# Patient Record
Sex: Male | Born: 1955
Health system: Southern US, Community
[De-identification: ages and names within clinical notes are randomized; demographics above are authoritative.]

## PROBLEM LIST (undated history)

## (undated) DIAGNOSIS — G43909 Migraine, unspecified, not intractable, without status migrainosus: Secondary | ICD-10-CM

## (undated) DIAGNOSIS — E785 Hyperlipidemia, unspecified: Secondary | ICD-10-CM

## (undated) HISTORY — DX: Hyperlipidemia, unspecified: E78.5

## (undated) HISTORY — DX: Migraine, unspecified, not intractable, without status migrainosus: G43.909

## (undated) HISTORY — PX: HIATAL HERNIA REPAIR: SHX195

---

## 2007-04-20 ENCOUNTER — Ambulatory Visit (HOSPITAL_COMMUNITY): Admission: RE | Admit: 2007-04-20 | Discharge: 2007-04-20 | Payer: Self-pay | Admitting: Surgery

## 2007-04-20 ENCOUNTER — Encounter (INDEPENDENT_AMBULATORY_CARE_PROVIDER_SITE_OTHER): Payer: Self-pay | Admitting: Surgery

## 2011-03-09 NOTE — H&P (Signed)
NAME:  Dave Ferguson, Dave Ferguson              ACCOUNT NO.:  [id number]   MEDICAL RECORD NO.:  [id number]          PATIENT TYPE:  AMB   LOCATION:  DAY                          FACILITY:  [facility]   PHYSICIAN:  Sandria Bales. Ezzard Standing, M.D.  DATE OF BIRTH:  09-May-1956   DATE OF ADMISSION:  04/20/2007  DATE OF DISCHARGE:                              HISTORY & PHYSICAL   PREOPERATIVE DIAGNOSES:  1. Bilateral inguinal hernias, left larger than right.  2. Epigastric abdominal wall hernia.  3. Lipoma of right lateral abdomen.   POSTOPERATIVE DIAGNOSES:  1. Medium-sized direct left inguinal hernia.  2. Small direct right inguinal hernia.  3. Lipoma of cord on left side.  4. Lipoma of right hip, approximately 4 cm in diameter.  5. Epigastric ventral hernia.   PROCEDURE:  1. Laparoscopic bilateral inguinal hernia repair with Onlay mesh.  2. Open repair of epigastric abdominal wall hernia.  3. Excision of right hip lipoma.   SURGEON:  Sandria Bales. Ezzard Standing, M.D.   FIRST ASSISTANT:  None.   ANESTHESIA:  General.   ESTIMATED BLOOD LOSS:  Minimal.   INDICATION FOR PROCEDURE:  Mr. Pellow is a 55 year old white male who  is a patient of Dr. Duane Lope, who comes with increasing left inguinal  hernia and he has had a knot that has popped up above his umbilicus  consistent with an epigastric hernia.  He enters to this these hernias  repaired.  He also showed me a lipoma along his right lower abdomen and  right hip which he will have excised at the same time.   The indications and potential complications were explained to the  patient; potential complications include, but are not limited to,  bleeding, infection, nerve injury and recurrence of a hernia.   OPERATIVE NOTE:  The patient was placed in the supine position and given  a general endotracheal anesthetic, given 1 g of Ancef at the initiation  of the procedure.  He had a Foley catheter in place.  His abdomen was  shaved and prepped with Betadine solution  and sterilely draped.   A timeout was held, identifying the patient and the planned procedures.   OPERATIVE NOTE:  An infraumbilical incision was made with sharp  dissection and carried down to the rectus abdominis fascia.  I went to  the right anterior rectus abdominis and made an incision and this  retracted the rectus abdominis muscle anteriorly.  I then used the [place]  Surgical balloon kit to create a space in the preperitoneal space.  This  included an approximately 11-mm balloon Hasson trocar.  I insufflated  the preperitoneal space first with balloon dilatation and second with  CO2.   I then placed two 5-mm trocars, 1 in the right lower quadrant and 1 in  the left lower quadrant and in this preperitoneal space, I carried out  dissection, identifying the pubic tubercle, the Cooper's ligament on  both sides, the inferior epigastric vessels, the cord structures and  then laterally to the cord structures.   I identified a medium-sized left inguinal hernia and he also had a small  lipoma  of the cord on the left side.  On the right side, he had a small  early direct right inguinal hernia; his cord structures were otherwise  unremarkable.   I used an Onlay mesh, using a 6 x 6 piece of Atrium mesh, which I cut  down to approximately 4 x 6 inches.  I placed the mesh in the  preperitoneal space on both sides, taking staples to the pubic tubercle,  to Cooper's ligament, the transversalis fascia.  I avoided the area on  both sides lateral to the iliac vessels and inferior the iliopubic  tract.  I used 10 staples on the left side and 13 staples on the right  side; the mesh laid flat, appeared intact and in good position.   I then removed my trocars in turn with desufflation and so placed a 0  Vicryl in the anterior rectus fascia and closed the skin with a 5-0  Vicryl suture.   I then turned my attention to the epigastric hernia.  This hernia was  immediately above the umbilicus, but was  not an umbilical hernia, but a  true epigastric hernia.  He had a prepectoral fat nodule which was about  3 cm in diameter that I reduced back into the peritoneal cavity; this  left an approximate 1.5-cm fascial defect, which I closed with  interrupted 0 Novofil sutures; I used 4 of these and was able to close  the hernia with this defect.  I did infiltrate local anesthetic into  this wound and closed the subcutaneous tissues with 3-0 Vicryl suture  and the skin with a 5-0 Vicryl suture.   I then turned my attention to the right hip, where he had an  approximately 4.5-cm lipoma.  I am an incision directly over this and  was able to remove the lipoma without much difficulty.  Hemostasis was  controlled with Bovie electrocautery.  I closed the skin with a 5-0  Vicryl suture.  Each wound was painted with tincture of Benzoin; I used  a total of approximately 47 mL of 0.25% Marcaine as a local anesthetic  involving all the incisions.  The wound was sterilely dressed with 4 x  4's and patient transferred to recovery room in good condition.  Sponge  and needle count was correct at the end of the case.  The patient  tolerated the procedure well.  The lipoma was sent off for pathologic  reasons.  There was no pathology on the hernias.      Sandria Bales. Ezzard Standing, M.D.  Electronically Signed     DHN/MEDQ  D:  04/20/2007  T:  04/20/2007  Job:  [id number]   cc:   C. Duane Lope, M.D.  Fax: [phone]

## 2013-12-24 ENCOUNTER — Other Ambulatory Visit: Payer: Self-pay | Admitting: Ophthalmology

## 2017-02-01 DIAGNOSIS — H40053 Ocular hypertension, bilateral: Secondary | ICD-10-CM | POA: Diagnosis not present

## 2017-05-02 DIAGNOSIS — M5431 Sciatica, right side: Secondary | ICD-10-CM | POA: Diagnosis not present

## 2017-05-04 DIAGNOSIS — H40053 Ocular hypertension, bilateral: Secondary | ICD-10-CM | POA: Diagnosis not present

## 2017-05-12 DIAGNOSIS — M5416 Radiculopathy, lumbar region: Secondary | ICD-10-CM | POA: Diagnosis not present

## 2017-05-17 ENCOUNTER — Other Ambulatory Visit: Payer: Self-pay | Admitting: Family Medicine

## 2017-05-17 DIAGNOSIS — M5416 Radiculopathy, lumbar region: Secondary | ICD-10-CM

## 2017-05-30 ENCOUNTER — Ambulatory Visit
Admission: RE | Admit: 2017-05-30 | Discharge: 2017-05-30 | Disposition: A | Discharge status: Home / Self Care | Payer: 59 | Source: Ambulatory Visit | Attending: Family Medicine | Admitting: Family Medicine

## 2017-05-30 DIAGNOSIS — M5127 Other intervertebral disc displacement, lumbosacral region: Secondary | ICD-10-CM | POA: Diagnosis not present

## 2017-05-30 DIAGNOSIS — M5416 Radiculopathy, lumbar region: Secondary | ICD-10-CM

## 2017-06-13 DIAGNOSIS — M5126 Other intervertebral disc displacement, lumbar region: Secondary | ICD-10-CM | POA: Diagnosis not present

## 2017-06-13 DIAGNOSIS — M4726 Other spondylosis with radiculopathy, lumbar region: Secondary | ICD-10-CM | POA: Diagnosis not present

## 2017-06-13 DIAGNOSIS — M5441 Lumbago with sciatica, right side: Secondary | ICD-10-CM | POA: Diagnosis not present

## 2017-06-14 DIAGNOSIS — M5126 Other intervertebral disc displacement, lumbar region: Secondary | ICD-10-CM | POA: Diagnosis not present

## 2017-06-14 DIAGNOSIS — M4726 Other spondylosis with radiculopathy, lumbar region: Secondary | ICD-10-CM | POA: Diagnosis not present

## 2017-06-14 DIAGNOSIS — M5441 Lumbago with sciatica, right side: Secondary | ICD-10-CM | POA: Diagnosis not present

## 2017-06-17 DIAGNOSIS — M5441 Lumbago with sciatica, right side: Secondary | ICD-10-CM | POA: Diagnosis not present

## 2017-06-17 DIAGNOSIS — M5126 Other intervertebral disc displacement, lumbar region: Secondary | ICD-10-CM | POA: Diagnosis not present

## 2017-06-17 DIAGNOSIS — M4726 Other spondylosis with radiculopathy, lumbar region: Secondary | ICD-10-CM | POA: Diagnosis not present

## 2017-06-21 DIAGNOSIS — M5441 Lumbago with sciatica, right side: Secondary | ICD-10-CM | POA: Diagnosis not present

## 2017-06-21 DIAGNOSIS — M5126 Other intervertebral disc displacement, lumbar region: Secondary | ICD-10-CM | POA: Diagnosis not present

## 2017-06-21 DIAGNOSIS — M4726 Other spondylosis with radiculopathy, lumbar region: Secondary | ICD-10-CM | POA: Diagnosis not present

## 2017-06-23 DIAGNOSIS — M5126 Other intervertebral disc displacement, lumbar region: Secondary | ICD-10-CM | POA: Diagnosis not present

## 2017-06-23 DIAGNOSIS — M5441 Lumbago with sciatica, right side: Secondary | ICD-10-CM | POA: Diagnosis not present

## 2017-06-23 DIAGNOSIS — M4726 Other spondylosis with radiculopathy, lumbar region: Secondary | ICD-10-CM | POA: Diagnosis not present

## 2017-06-28 DIAGNOSIS — M4726 Other spondylosis with radiculopathy, lumbar region: Secondary | ICD-10-CM | POA: Diagnosis not present

## 2017-06-28 DIAGNOSIS — M5441 Lumbago with sciatica, right side: Secondary | ICD-10-CM | POA: Diagnosis not present

## 2017-06-28 DIAGNOSIS — M5126 Other intervertebral disc displacement, lumbar region: Secondary | ICD-10-CM | POA: Diagnosis not present

## 2017-07-01 DIAGNOSIS — M5126 Other intervertebral disc displacement, lumbar region: Secondary | ICD-10-CM | POA: Diagnosis not present

## 2017-07-01 DIAGNOSIS — M4726 Other spondylosis with radiculopathy, lumbar region: Secondary | ICD-10-CM | POA: Diagnosis not present

## 2017-07-01 DIAGNOSIS — M5441 Lumbago with sciatica, right side: Secondary | ICD-10-CM | POA: Diagnosis not present

## 2017-07-05 DIAGNOSIS — M5126 Other intervertebral disc displacement, lumbar region: Secondary | ICD-10-CM | POA: Diagnosis not present

## 2017-07-06 DIAGNOSIS — M5126 Other intervertebral disc displacement, lumbar region: Secondary | ICD-10-CM | POA: Diagnosis not present

## 2017-07-06 DIAGNOSIS — M4726 Other spondylosis with radiculopathy, lumbar region: Secondary | ICD-10-CM | POA: Diagnosis not present

## 2017-07-06 DIAGNOSIS — M5441 Lumbago with sciatica, right side: Secondary | ICD-10-CM | POA: Diagnosis not present

## 2017-07-12 DIAGNOSIS — M5126 Other intervertebral disc displacement, lumbar region: Secondary | ICD-10-CM | POA: Diagnosis not present

## 2017-07-12 DIAGNOSIS — M4726 Other spondylosis with radiculopathy, lumbar region: Secondary | ICD-10-CM | POA: Diagnosis not present

## 2017-07-12 DIAGNOSIS — M5441 Lumbago with sciatica, right side: Secondary | ICD-10-CM | POA: Diagnosis not present

## 2017-07-19 DIAGNOSIS — M4726 Other spondylosis with radiculopathy, lumbar region: Secondary | ICD-10-CM | POA: Diagnosis not present

## 2017-07-19 DIAGNOSIS — M5441 Lumbago with sciatica, right side: Secondary | ICD-10-CM | POA: Diagnosis not present

## 2017-07-19 DIAGNOSIS — M5126 Other intervertebral disc displacement, lumbar region: Secondary | ICD-10-CM | POA: Diagnosis not present

## 2017-07-26 DIAGNOSIS — M5126 Other intervertebral disc displacement, lumbar region: Secondary | ICD-10-CM | POA: Diagnosis not present

## 2017-07-26 DIAGNOSIS — M4726 Other spondylosis with radiculopathy, lumbar region: Secondary | ICD-10-CM | POA: Diagnosis not present

## 2017-07-26 DIAGNOSIS — Z23 Encounter for immunization: Secondary | ICD-10-CM | POA: Diagnosis not present

## 2017-07-26 DIAGNOSIS — M5441 Lumbago with sciatica, right side: Secondary | ICD-10-CM | POA: Diagnosis not present

## 2017-08-04 DIAGNOSIS — M5126 Other intervertebral disc displacement, lumbar region: Secondary | ICD-10-CM | POA: Diagnosis not present

## 2017-08-04 DIAGNOSIS — M4726 Other spondylosis with radiculopathy, lumbar region: Secondary | ICD-10-CM | POA: Diagnosis not present

## 2017-08-04 DIAGNOSIS — M5441 Lumbago with sciatica, right side: Secondary | ICD-10-CM | POA: Diagnosis not present

## 2017-08-08 DIAGNOSIS — M5126 Other intervertebral disc displacement, lumbar region: Secondary | ICD-10-CM | POA: Diagnosis not present

## 2017-08-16 DIAGNOSIS — M5126 Other intervertebral disc displacement, lumbar region: Secondary | ICD-10-CM | POA: Diagnosis not present

## 2017-08-16 DIAGNOSIS — M4726 Other spondylosis with radiculopathy, lumbar region: Secondary | ICD-10-CM | POA: Diagnosis not present

## 2017-08-16 DIAGNOSIS — M5441 Lumbago with sciatica, right side: Secondary | ICD-10-CM | POA: Diagnosis not present

## 2017-08-23 DIAGNOSIS — D225 Melanocytic nevi of trunk: Secondary | ICD-10-CM | POA: Diagnosis not present

## 2017-08-23 DIAGNOSIS — Z1283 Encounter for screening for malignant neoplasm of skin: Secondary | ICD-10-CM | POA: Diagnosis not present

## 2017-08-30 DIAGNOSIS — M4726 Other spondylosis with radiculopathy, lumbar region: Secondary | ICD-10-CM | POA: Diagnosis not present

## 2017-08-30 DIAGNOSIS — M5126 Other intervertebral disc displacement, lumbar region: Secondary | ICD-10-CM | POA: Diagnosis not present

## 2017-08-30 DIAGNOSIS — M5441 Lumbago with sciatica, right side: Secondary | ICD-10-CM | POA: Diagnosis not present

## 2017-09-01 DIAGNOSIS — Z0000 Encounter for general adult medical examination without abnormal findings: Secondary | ICD-10-CM | POA: Diagnosis not present

## 2017-09-01 DIAGNOSIS — M5136 Other intervertebral disc degeneration, lumbar region: Secondary | ICD-10-CM | POA: Diagnosis not present

## 2017-09-01 DIAGNOSIS — D229 Melanocytic nevi, unspecified: Secondary | ICD-10-CM | POA: Diagnosis not present

## 2017-09-01 DIAGNOSIS — E7800 Pure hypercholesterolemia, unspecified: Secondary | ICD-10-CM | POA: Diagnosis not present

## 2017-09-01 DIAGNOSIS — H409 Unspecified glaucoma: Secondary | ICD-10-CM | POA: Diagnosis not present

## 2017-09-13 DIAGNOSIS — M4726 Other spondylosis with radiculopathy, lumbar region: Secondary | ICD-10-CM | POA: Diagnosis not present

## 2017-09-13 DIAGNOSIS — H40053 Ocular hypertension, bilateral: Secondary | ICD-10-CM | POA: Diagnosis not present

## 2017-09-13 DIAGNOSIS — M5441 Lumbago with sciatica, right side: Secondary | ICD-10-CM | POA: Diagnosis not present

## 2017-09-13 DIAGNOSIS — M5126 Other intervertebral disc displacement, lumbar region: Secondary | ICD-10-CM | POA: Diagnosis not present

## 2017-09-13 DIAGNOSIS — H524 Presbyopia: Secondary | ICD-10-CM | POA: Diagnosis not present

## 2017-09-27 DIAGNOSIS — M5441 Lumbago with sciatica, right side: Secondary | ICD-10-CM | POA: Diagnosis not present

## 2017-09-27 DIAGNOSIS — M5126 Other intervertebral disc displacement, lumbar region: Secondary | ICD-10-CM | POA: Diagnosis not present

## 2017-09-27 DIAGNOSIS — M4726 Other spondylosis with radiculopathy, lumbar region: Secondary | ICD-10-CM | POA: Diagnosis not present

## 2017-10-14 DIAGNOSIS — M5441 Lumbago with sciatica, right side: Secondary | ICD-10-CM | POA: Diagnosis not present

## 2017-10-14 DIAGNOSIS — M4726 Other spondylosis with radiculopathy, lumbar region: Secondary | ICD-10-CM | POA: Diagnosis not present

## 2017-10-14 DIAGNOSIS — M5126 Other intervertebral disc displacement, lumbar region: Secondary | ICD-10-CM | POA: Diagnosis not present

## 2017-10-27 DIAGNOSIS — M5441 Lumbago with sciatica, right side: Secondary | ICD-10-CM | POA: Diagnosis not present

## 2017-10-27 DIAGNOSIS — M4726 Other spondylosis with radiculopathy, lumbar region: Secondary | ICD-10-CM | POA: Diagnosis not present

## 2017-10-27 DIAGNOSIS — M5126 Other intervertebral disc displacement, lumbar region: Secondary | ICD-10-CM | POA: Diagnosis not present

## 2017-11-09 DIAGNOSIS — M5441 Lumbago with sciatica, right side: Secondary | ICD-10-CM | POA: Diagnosis not present

## 2017-11-09 DIAGNOSIS — M5126 Other intervertebral disc displacement, lumbar region: Secondary | ICD-10-CM | POA: Diagnosis not present

## 2017-11-09 DIAGNOSIS — M4726 Other spondylosis with radiculopathy, lumbar region: Secondary | ICD-10-CM | POA: Diagnosis not present

## 2017-11-23 DIAGNOSIS — M4726 Other spondylosis with radiculopathy, lumbar region: Secondary | ICD-10-CM | POA: Diagnosis not present

## 2017-11-23 DIAGNOSIS — M5126 Other intervertebral disc displacement, lumbar region: Secondary | ICD-10-CM | POA: Diagnosis not present

## 2017-11-23 DIAGNOSIS — M5441 Lumbago with sciatica, right side: Secondary | ICD-10-CM | POA: Diagnosis not present

## 2017-12-14 DIAGNOSIS — M5441 Lumbago with sciatica, right side: Secondary | ICD-10-CM | POA: Diagnosis not present

## 2017-12-14 DIAGNOSIS — M5126 Other intervertebral disc displacement, lumbar region: Secondary | ICD-10-CM | POA: Diagnosis not present

## 2017-12-14 DIAGNOSIS — M4726 Other spondylosis with radiculopathy, lumbar region: Secondary | ICD-10-CM | POA: Diagnosis not present

## 2018-01-04 DIAGNOSIS — M5441 Lumbago with sciatica, right side: Secondary | ICD-10-CM | POA: Diagnosis not present

## 2018-01-04 DIAGNOSIS — M5126 Other intervertebral disc displacement, lumbar region: Secondary | ICD-10-CM | POA: Diagnosis not present

## 2018-01-04 DIAGNOSIS — M4726 Other spondylosis with radiculopathy, lumbar region: Secondary | ICD-10-CM | POA: Diagnosis not present

## 2018-01-25 DIAGNOSIS — M5441 Lumbago with sciatica, right side: Secondary | ICD-10-CM | POA: Diagnosis not present

## 2018-01-25 DIAGNOSIS — M5126 Other intervertebral disc displacement, lumbar region: Secondary | ICD-10-CM | POA: Diagnosis not present

## 2018-01-25 DIAGNOSIS — M4726 Other spondylosis with radiculopathy, lumbar region: Secondary | ICD-10-CM | POA: Diagnosis not present

## 2018-02-22 DIAGNOSIS — M5126 Other intervertebral disc displacement, lumbar region: Secondary | ICD-10-CM | POA: Diagnosis not present

## 2018-02-22 DIAGNOSIS — M5441 Lumbago with sciatica, right side: Secondary | ICD-10-CM | POA: Diagnosis not present

## 2018-02-22 DIAGNOSIS — M4726 Other spondylosis with radiculopathy, lumbar region: Secondary | ICD-10-CM | POA: Diagnosis not present

## 2018-03-07 DIAGNOSIS — H40053 Ocular hypertension, bilateral: Secondary | ICD-10-CM | POA: Diagnosis not present

## 2018-03-15 DIAGNOSIS — M5126 Other intervertebral disc displacement, lumbar region: Secondary | ICD-10-CM | POA: Diagnosis not present

## 2018-03-15 DIAGNOSIS — M4726 Other spondylosis with radiculopathy, lumbar region: Secondary | ICD-10-CM | POA: Diagnosis not present

## 2018-03-15 DIAGNOSIS — M5441 Lumbago with sciatica, right side: Secondary | ICD-10-CM | POA: Diagnosis not present

## 2018-04-12 DIAGNOSIS — M5126 Other intervertebral disc displacement, lumbar region: Secondary | ICD-10-CM | POA: Diagnosis not present

## 2018-04-12 DIAGNOSIS — M4726 Other spondylosis with radiculopathy, lumbar region: Secondary | ICD-10-CM | POA: Diagnosis not present

## 2018-04-12 DIAGNOSIS — M5441 Lumbago with sciatica, right side: Secondary | ICD-10-CM | POA: Diagnosis not present

## 2018-05-10 DIAGNOSIS — M4726 Other spondylosis with radiculopathy, lumbar region: Secondary | ICD-10-CM | POA: Diagnosis not present

## 2018-05-10 DIAGNOSIS — M5441 Lumbago with sciatica, right side: Secondary | ICD-10-CM | POA: Diagnosis not present

## 2018-05-10 DIAGNOSIS — M5126 Other intervertebral disc displacement, lumbar region: Secondary | ICD-10-CM | POA: Diagnosis not present

## 2018-06-01 DIAGNOSIS — M4726 Other spondylosis with radiculopathy, lumbar region: Secondary | ICD-10-CM | POA: Diagnosis not present

## 2018-06-01 DIAGNOSIS — M5441 Lumbago with sciatica, right side: Secondary | ICD-10-CM | POA: Diagnosis not present

## 2018-06-01 DIAGNOSIS — M5126 Other intervertebral disc displacement, lumbar region: Secondary | ICD-10-CM | POA: Diagnosis not present

## 2018-07-05 DIAGNOSIS — M5441 Lumbago with sciatica, right side: Secondary | ICD-10-CM | POA: Diagnosis not present

## 2018-07-05 DIAGNOSIS — M4726 Other spondylosis with radiculopathy, lumbar region: Secondary | ICD-10-CM | POA: Diagnosis not present

## 2018-07-05 DIAGNOSIS — M5126 Other intervertebral disc displacement, lumbar region: Secondary | ICD-10-CM | POA: Diagnosis not present

## 2018-08-09 DIAGNOSIS — M4726 Other spondylosis with radiculopathy, lumbar region: Secondary | ICD-10-CM | POA: Diagnosis not present

## 2018-08-09 DIAGNOSIS — N644 Mastodynia: Secondary | ICD-10-CM | POA: Diagnosis not present

## 2018-08-09 DIAGNOSIS — M5441 Lumbago with sciatica, right side: Secondary | ICD-10-CM | POA: Diagnosis not present

## 2018-08-09 DIAGNOSIS — M5126 Other intervertebral disc displacement, lumbar region: Secondary | ICD-10-CM | POA: Diagnosis not present

## 2018-08-15 ENCOUNTER — Other Ambulatory Visit: Payer: Self-pay | Admitting: Family Medicine

## 2018-08-15 DIAGNOSIS — N644 Mastodynia: Secondary | ICD-10-CM

## 2018-08-16 ENCOUNTER — Other Ambulatory Visit: Payer: Self-pay | Admitting: Family Medicine

## 2018-08-16 DIAGNOSIS — N644 Mastodynia: Secondary | ICD-10-CM

## 2018-08-21 ENCOUNTER — Ambulatory Visit
Admission: RE | Admit: 2018-08-21 | Discharge: 2018-08-21 | Disposition: A | Discharge status: Home / Self Care | Payer: 59 | Source: Ambulatory Visit | Attending: Family Medicine | Admitting: Family Medicine

## 2018-08-21 ENCOUNTER — Ambulatory Visit: Payer: 59

## 2018-08-21 DIAGNOSIS — N644 Mastodynia: Secondary | ICD-10-CM

## 2018-08-21 DIAGNOSIS — R928 Other abnormal and inconclusive findings on diagnostic imaging of breast: Secondary | ICD-10-CM | POA: Diagnosis not present

## 2018-08-31 DIAGNOSIS — Z125 Encounter for screening for malignant neoplasm of prostate: Secondary | ICD-10-CM | POA: Diagnosis not present

## 2018-08-31 DIAGNOSIS — Z0000 Encounter for general adult medical examination without abnormal findings: Secondary | ICD-10-CM | POA: Diagnosis not present

## 2018-08-31 DIAGNOSIS — E7800 Pure hypercholesterolemia, unspecified: Secondary | ICD-10-CM | POA: Diagnosis not present

## 2018-09-06 DIAGNOSIS — Z0000 Encounter for general adult medical examination without abnormal findings: Secondary | ICD-10-CM | POA: Diagnosis not present

## 2018-09-14 DIAGNOSIS — M4726 Other spondylosis with radiculopathy, lumbar region: Secondary | ICD-10-CM | POA: Diagnosis not present

## 2018-09-14 DIAGNOSIS — M5126 Other intervertebral disc displacement, lumbar region: Secondary | ICD-10-CM | POA: Diagnosis not present

## 2018-09-14 DIAGNOSIS — M5441 Lumbago with sciatica, right side: Secondary | ICD-10-CM | POA: Diagnosis not present

## 2018-09-18 DIAGNOSIS — H40053 Ocular hypertension, bilateral: Secondary | ICD-10-CM | POA: Diagnosis not present

## 2018-09-18 DIAGNOSIS — H524 Presbyopia: Secondary | ICD-10-CM | POA: Diagnosis not present

## 2018-10-12 DIAGNOSIS — M4726 Other spondylosis with radiculopathy, lumbar region: Secondary | ICD-10-CM | POA: Diagnosis not present

## 2018-10-12 DIAGNOSIS — M5126 Other intervertebral disc displacement, lumbar region: Secondary | ICD-10-CM | POA: Diagnosis not present

## 2018-10-12 DIAGNOSIS — M5441 Lumbago with sciatica, right side: Secondary | ICD-10-CM | POA: Diagnosis not present

## 2018-10-13 DIAGNOSIS — Z0000 Encounter for general adult medical examination without abnormal findings: Secondary | ICD-10-CM | POA: Diagnosis not present

## 2018-11-07 DIAGNOSIS — M5126 Other intervertebral disc displacement, lumbar region: Secondary | ICD-10-CM | POA: Diagnosis not present

## 2018-11-07 DIAGNOSIS — M5441 Lumbago with sciatica, right side: Secondary | ICD-10-CM | POA: Diagnosis not present

## 2018-11-07 DIAGNOSIS — M4726 Other spondylosis with radiculopathy, lumbar region: Secondary | ICD-10-CM | POA: Diagnosis not present

## 2018-12-05 DIAGNOSIS — M5126 Other intervertebral disc displacement, lumbar region: Secondary | ICD-10-CM | POA: Diagnosis not present

## 2018-12-05 DIAGNOSIS — M5441 Lumbago with sciatica, right side: Secondary | ICD-10-CM | POA: Diagnosis not present

## 2018-12-05 DIAGNOSIS — M4726 Other spondylosis with radiculopathy, lumbar region: Secondary | ICD-10-CM | POA: Diagnosis not present

## 2019-01-15 DIAGNOSIS — M4726 Other spondylosis with radiculopathy, lumbar region: Secondary | ICD-10-CM | POA: Diagnosis not present

## 2019-01-15 DIAGNOSIS — M5441 Lumbago with sciatica, right side: Secondary | ICD-10-CM | POA: Diagnosis not present

## 2019-01-15 DIAGNOSIS — M5126 Other intervertebral disc displacement, lumbar region: Secondary | ICD-10-CM | POA: Diagnosis not present

## 2019-01-25 DIAGNOSIS — L237 Allergic contact dermatitis due to plants, except food: Secondary | ICD-10-CM | POA: Diagnosis not present

## 2019-02-28 DIAGNOSIS — H40053 Ocular hypertension, bilateral: Secondary | ICD-10-CM | POA: Diagnosis not present

## 2019-07-14 IMAGING — MG DIGITAL DIAGNOSTIC BILATERAL MAMMOGRAM WITH TOMO AND CAD
8 series · 9 of 24 positions shown · non-contrast
Comparison: None.

CLINICAL DATA: 62-year-old male complaining of pain and a palpable
abnormality in the right breast.

EXAM:
DIGITAL DIAGNOSTIC BILATERAL MAMMOGRAM WITH CAD AND TOMO

[L CC synth-2D]
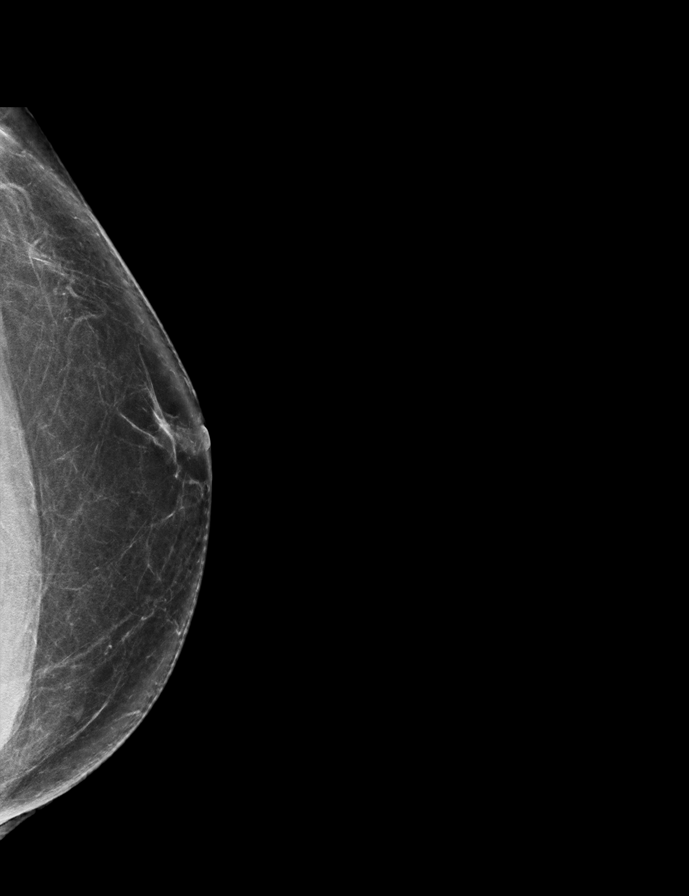

[R MLO synth-2D]
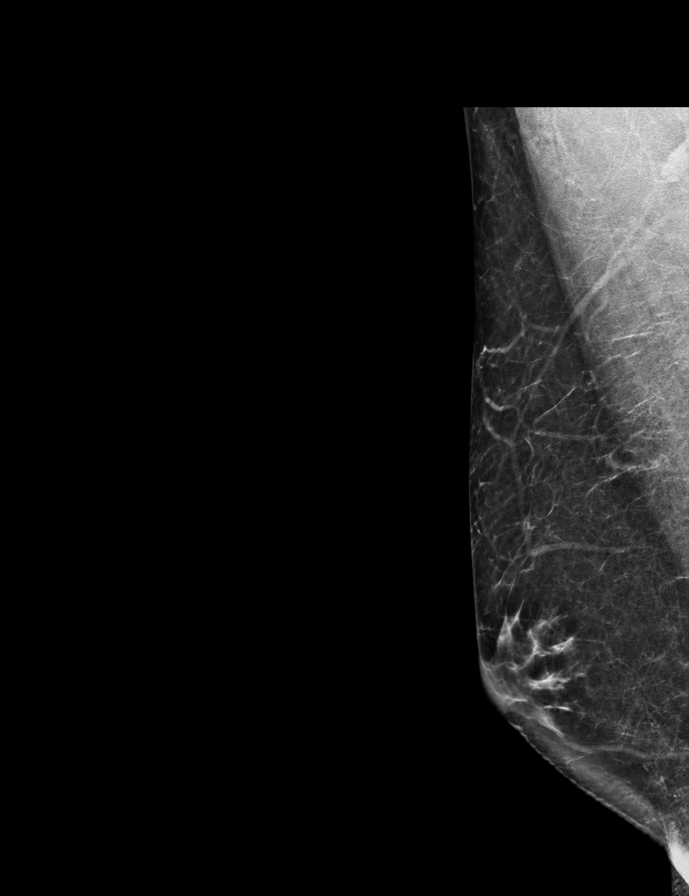

[L MLO synth-2D]
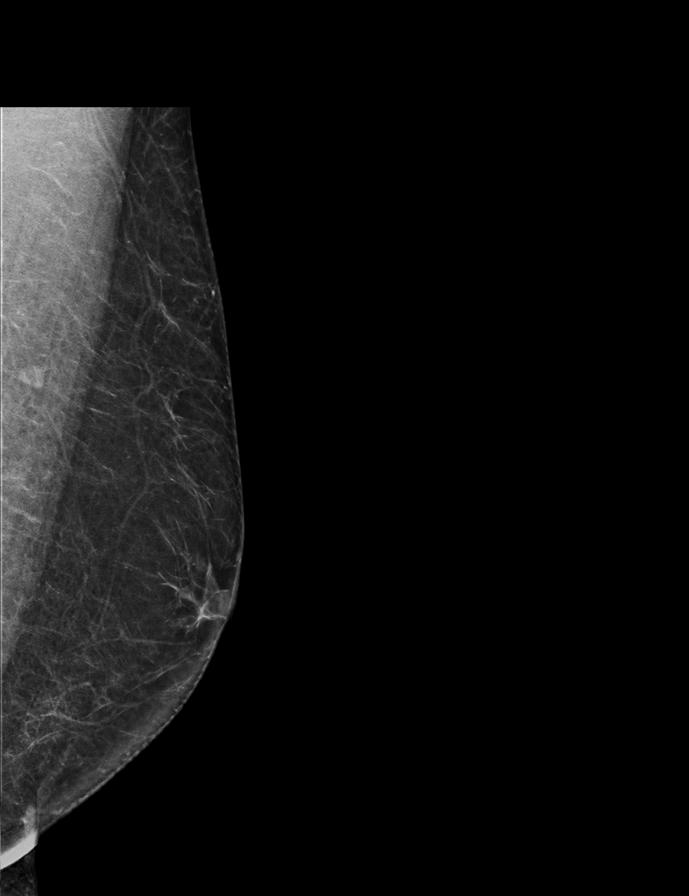

[R CC synth-2D]
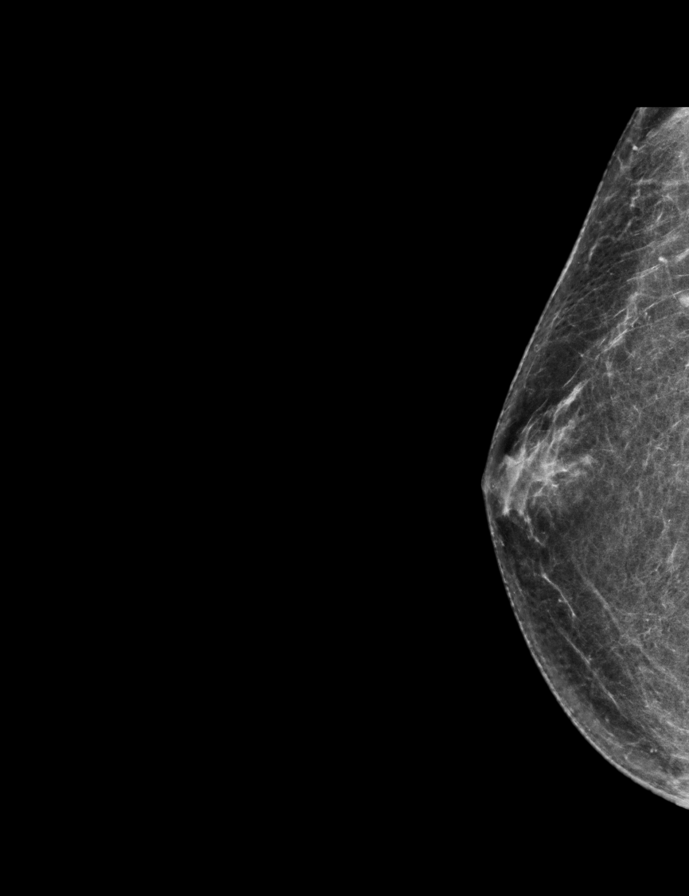

[R CC tomo · 2 of 65 frames shown]
[frame 21/65]
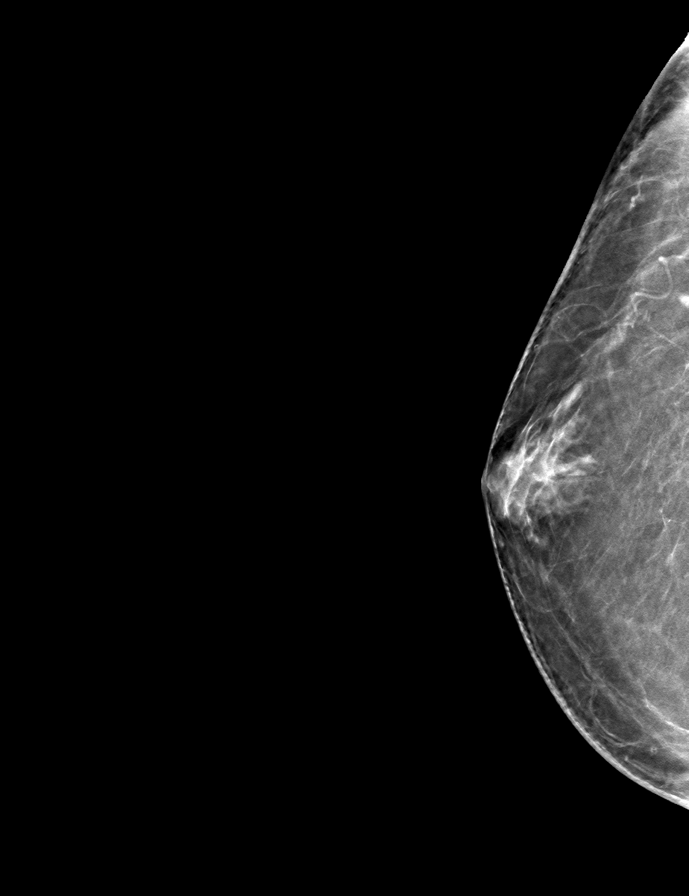
[frame 33/65]
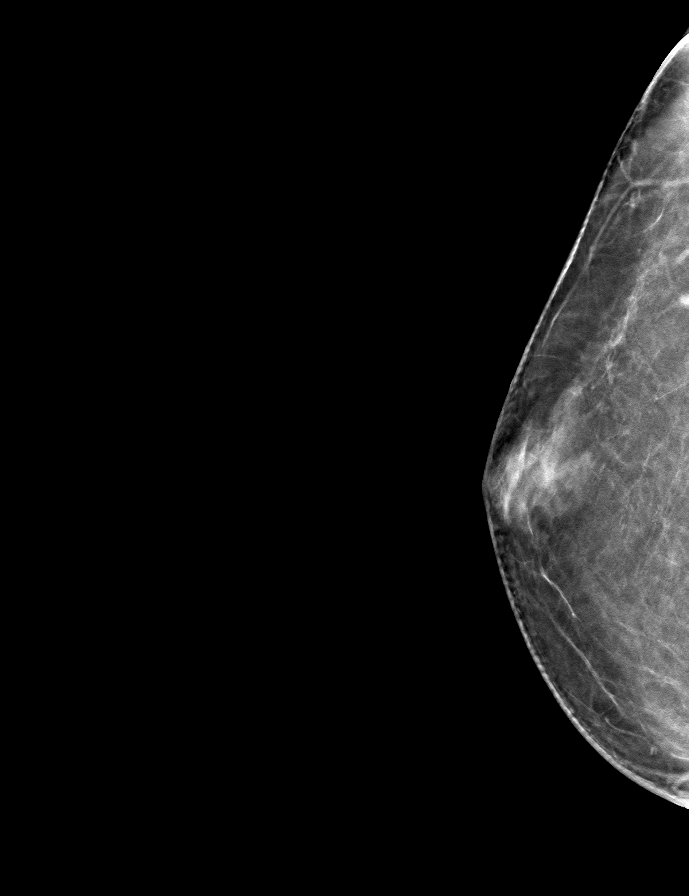

[R MLO tomo · tomo slice 34/67.0]
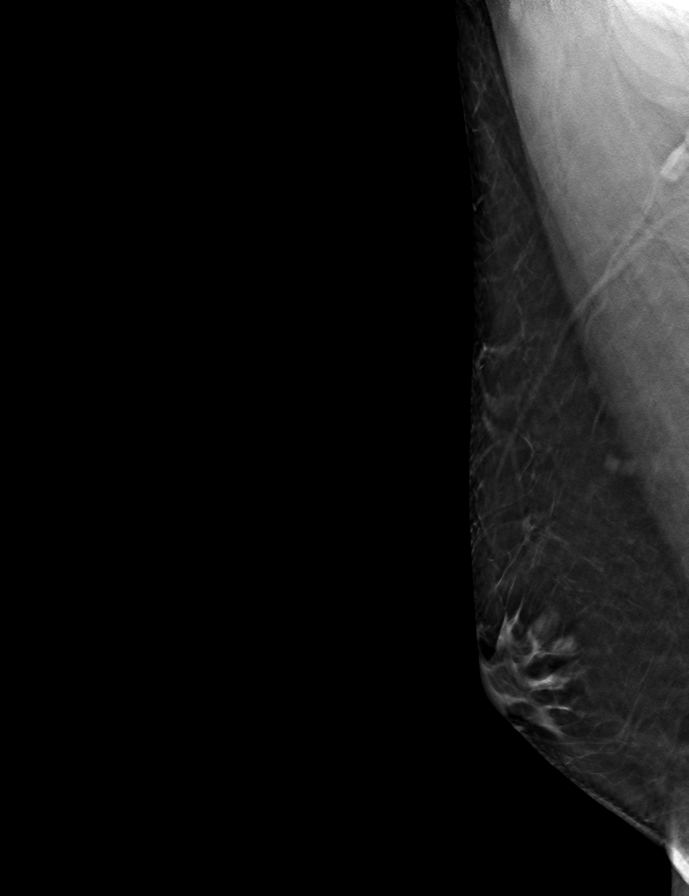

[L CC tomo · tomo slice 37/73.0]
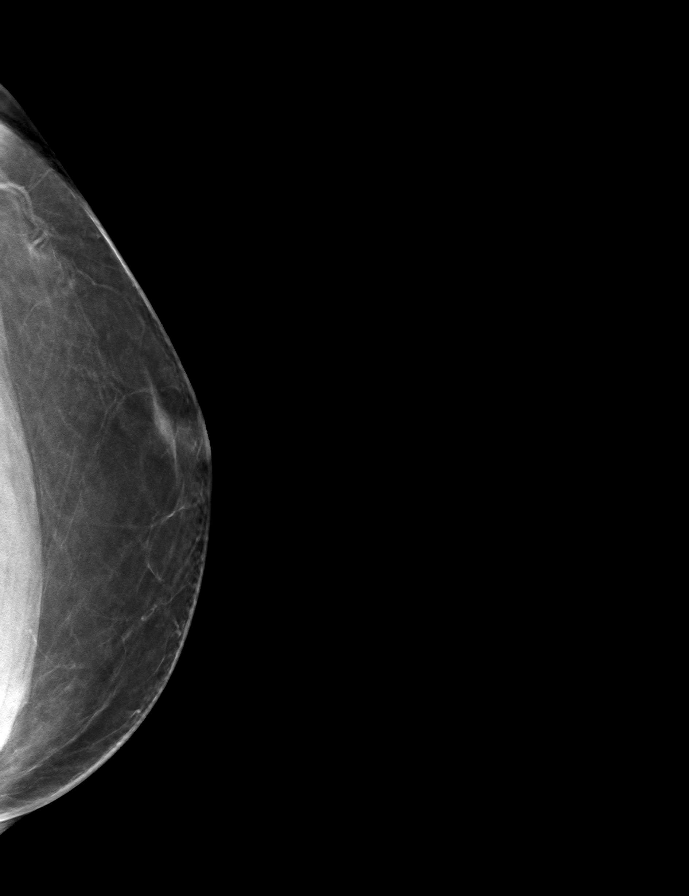

[L MLO tomo · tomo slice 33/66.0]
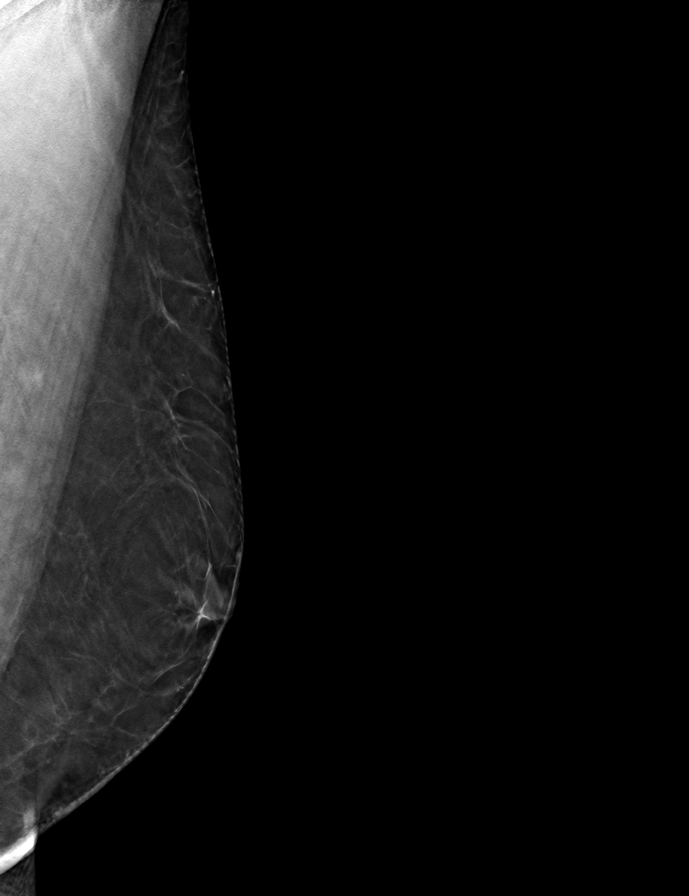

[9 of 24 positions shown; findings below may reference images not displayed]

ACR Breast Density Category b: There are scattered areas of
fibroglandular density.
FINDINGS: There is asymmetric fibroglandular tissue in the subareolar region
of the right breast with interspersed fat consistent with
gynecomastia. This is less prominent on the left. No suspicious mass
or malignant type microcalcifications identified in either breast.

Mammographic images were processed with CAD.
IMPRESSION: Bilateral gynecomastia that is more prominent in the right breast.
No evidence of malignancy in either breast.

RECOMMENDATION:
If the clinical exam remains benign/stable no follow-up is needed.

I have discussed the findings and recommendations with the patient.
Results were also provided in writing at the conclusion of the
visit. If applicable, a reminder letter will be sent to the patient
regarding the next appointment.

BI-RADS CATEGORY  2: Benign.

## 2019-09-18 ENCOUNTER — Other Ambulatory Visit: Payer: Self-pay | Admitting: Chiropractic Medicine

## 2019-09-18 DIAGNOSIS — M25562 Pain in left knee: Secondary | ICD-10-CM

## 2019-09-19 ENCOUNTER — Other Ambulatory Visit: Payer: Self-pay

## 2019-09-19 ENCOUNTER — Ambulatory Visit
Admission: RE | Admit: 2019-09-19 | Discharge: 2019-09-19 | Disposition: A | Discharge status: Home / Self Care | Payer: 59 | Source: Ambulatory Visit | Attending: Chiropractic Medicine | Admitting: Chiropractic Medicine

## 2019-09-19 DIAGNOSIS — M25562 Pain in left knee: Secondary | ICD-10-CM

## 2019-10-16 ENCOUNTER — Ambulatory Visit (HOSPITAL_COMMUNITY)
Admission: RE | Admit: 2019-10-16 | Discharge: 2019-10-16 | Disposition: A | Discharge status: Home / Self Care | Payer: 59 | Source: Ambulatory Visit | Attending: Chiropractic Medicine | Admitting: Chiropractic Medicine

## 2019-10-16 ENCOUNTER — Other Ambulatory Visit (HOSPITAL_COMMUNITY): Payer: Self-pay | Admitting: Chiropractic Medicine

## 2019-10-16 ENCOUNTER — Other Ambulatory Visit: Payer: Self-pay

## 2019-10-16 DIAGNOSIS — M79605 Pain in left leg: Secondary | ICD-10-CM | POA: Diagnosis not present

## 2019-10-16 DIAGNOSIS — M7989 Other specified soft tissue disorders: Secondary | ICD-10-CM | POA: Diagnosis not present

## 2019-10-16 NOTE — Progress Notes (Signed)
Lower extremity venous has been completed.   Preliminary results in CV Proc.   Results given to [facility].  Abram Sander 10/16/2019 4:16 PM

## 2022-06-01 DIAGNOSIS — H25812 Combined forms of age-related cataract, left eye: Secondary | ICD-10-CM | POA: Diagnosis not present

## 2022-06-01 DIAGNOSIS — H4010X0 Unspecified open-angle glaucoma, stage unspecified: Secondary | ICD-10-CM | POA: Diagnosis not present

## 2022-06-01 DIAGNOSIS — E785 Hyperlipidemia, unspecified: Secondary | ICD-10-CM | POA: Diagnosis not present

## 2022-06-01 DIAGNOSIS — H40022 Open angle with borderline findings, high risk, left eye: Secondary | ICD-10-CM | POA: Diagnosis not present

## 2022-07-13 DIAGNOSIS — H40021 Open angle with borderline findings, high risk, right eye: Secondary | ICD-10-CM | POA: Diagnosis not present

## 2022-07-13 DIAGNOSIS — H25811 Combined forms of age-related cataract, right eye: Secondary | ICD-10-CM | POA: Diagnosis not present

## 2022-07-13 DIAGNOSIS — Z79899 Other long term (current) drug therapy: Secondary | ICD-10-CM | POA: Diagnosis not present

## 2022-07-13 DIAGNOSIS — E785 Hyperlipidemia, unspecified: Secondary | ICD-10-CM | POA: Diagnosis not present

## 2022-07-13 DIAGNOSIS — H2511 Age-related nuclear cataract, right eye: Secondary | ICD-10-CM | POA: Diagnosis not present

## 2022-07-13 DIAGNOSIS — H4010X0 Unspecified open-angle glaucoma, stage unspecified: Secondary | ICD-10-CM | POA: Diagnosis not present

## 2022-07-21 DIAGNOSIS — E7800 Pure hypercholesterolemia, unspecified: Secondary | ICD-10-CM | POA: Diagnosis not present

## 2022-07-21 DIAGNOSIS — R7301 Impaired fasting glucose: Secondary | ICD-10-CM | POA: Diagnosis not present

## 2022-07-28 DIAGNOSIS — R7303 Prediabetes: Secondary | ICD-10-CM | POA: Diagnosis not present

## 2022-07-28 DIAGNOSIS — Z0000 Encounter for general adult medical examination without abnormal findings: Secondary | ICD-10-CM | POA: Diagnosis not present

## 2022-07-28 DIAGNOSIS — E7800 Pure hypercholesterolemia, unspecified: Secondary | ICD-10-CM | POA: Diagnosis not present

## 2022-07-28 DIAGNOSIS — I4510 Unspecified right bundle-branch block: Secondary | ICD-10-CM | POA: Diagnosis not present

## 2022-11-24 DIAGNOSIS — D225 Melanocytic nevi of trunk: Secondary | ICD-10-CM | POA: Diagnosis not present

## 2022-11-24 DIAGNOSIS — Z1283 Encounter for screening for malignant neoplasm of skin: Secondary | ICD-10-CM | POA: Diagnosis not present

## 2022-11-24 DIAGNOSIS — D485 Neoplasm of uncertain behavior of skin: Secondary | ICD-10-CM | POA: Diagnosis not present

## 2023-03-30 DIAGNOSIS — Z961 Presence of intraocular lens: Secondary | ICD-10-CM | POA: Diagnosis not present

## 2023-03-30 DIAGNOSIS — H40023 Open angle with borderline findings, high risk, bilateral: Secondary | ICD-10-CM | POA: Diagnosis not present

## 2023-07-29 DIAGNOSIS — Z0000 Encounter for general adult medical examination without abnormal findings: Secondary | ICD-10-CM | POA: Diagnosis not present

## 2023-07-29 DIAGNOSIS — E7800 Pure hypercholesterolemia, unspecified: Secondary | ICD-10-CM | POA: Diagnosis not present

## 2023-07-29 DIAGNOSIS — I4510 Unspecified right bundle-branch block: Secondary | ICD-10-CM | POA: Diagnosis not present

## 2023-07-29 DIAGNOSIS — G43909 Migraine, unspecified, not intractable, without status migrainosus: Secondary | ICD-10-CM | POA: Diagnosis not present

## 2023-07-29 DIAGNOSIS — R7303 Prediabetes: Secondary | ICD-10-CM | POA: Diagnosis not present

## 2023-08-02 DIAGNOSIS — G43909 Migraine, unspecified, not intractable, without status migrainosus: Secondary | ICD-10-CM | POA: Diagnosis not present

## 2023-08-02 DIAGNOSIS — M25552 Pain in left hip: Secondary | ICD-10-CM | POA: Diagnosis not present

## 2023-08-02 DIAGNOSIS — Z23 Encounter for immunization: Secondary | ICD-10-CM | POA: Diagnosis not present

## 2023-08-02 DIAGNOSIS — M545 Low back pain, unspecified: Secondary | ICD-10-CM | POA: Diagnosis not present

## 2023-08-02 DIAGNOSIS — Z0000 Encounter for general adult medical examination without abnormal findings: Secondary | ICD-10-CM | POA: Diagnosis not present

## 2023-08-02 DIAGNOSIS — Z9181 History of falling: Secondary | ICD-10-CM | POA: Diagnosis not present

## 2023-08-02 DIAGNOSIS — R7303 Prediabetes: Secondary | ICD-10-CM | POA: Diagnosis not present

## 2023-08-02 DIAGNOSIS — E7800 Pure hypercholesterolemia, unspecified: Secondary | ICD-10-CM | POA: Diagnosis not present

## 2024-01-25 DIAGNOSIS — D225 Melanocytic nevi of trunk: Secondary | ICD-10-CM | POA: Diagnosis not present

## 2024-01-25 DIAGNOSIS — D485 Neoplasm of uncertain behavior of skin: Secondary | ICD-10-CM | POA: Diagnosis not present

## 2024-01-25 DIAGNOSIS — Z1283 Encounter for screening for malignant neoplasm of skin: Secondary | ICD-10-CM | POA: Diagnosis not present

## 2024-03-28 DIAGNOSIS — H524 Presbyopia: Secondary | ICD-10-CM | POA: Diagnosis not present

## 2024-03-28 DIAGNOSIS — H35373 Puckering of macula, bilateral: Secondary | ICD-10-CM | POA: Diagnosis not present

## 2024-03-28 DIAGNOSIS — H25811 Combined forms of age-related cataract, right eye: Secondary | ICD-10-CM | POA: Diagnosis not present

## 2024-03-28 DIAGNOSIS — H04123 Dry eye syndrome of bilateral lacrimal glands: Secondary | ICD-10-CM | POA: Diagnosis not present

## 2024-03-28 DIAGNOSIS — H40013 Open angle with borderline findings, low risk, bilateral: Secondary | ICD-10-CM | POA: Diagnosis not present

## 2024-03-28 DIAGNOSIS — H01009 Unspecified blepharitis unspecified eye, unspecified eyelid: Secondary | ICD-10-CM | POA: Diagnosis not present

## 2024-03-28 DIAGNOSIS — Z83511 Family history of glaucoma: Secondary | ICD-10-CM | POA: Diagnosis not present

## 2024-07-27 DIAGNOSIS — R7303 Prediabetes: Secondary | ICD-10-CM | POA: Diagnosis not present

## 2024-07-27 DIAGNOSIS — G43909 Migraine, unspecified, not intractable, without status migrainosus: Secondary | ICD-10-CM | POA: Diagnosis not present

## 2024-07-27 DIAGNOSIS — I4510 Unspecified right bundle-branch block: Secondary | ICD-10-CM | POA: Diagnosis not present

## 2024-07-27 DIAGNOSIS — E7800 Pure hypercholesterolemia, unspecified: Secondary | ICD-10-CM | POA: Diagnosis not present

## 2024-07-27 LAB — LAB REPORT - SCANNED
A1c: 6
EGFR: 102

## 2024-08-10 DIAGNOSIS — E7800 Pure hypercholesterolemia, unspecified: Secondary | ICD-10-CM | POA: Diagnosis not present

## 2024-08-10 DIAGNOSIS — M542 Cervicalgia: Secondary | ICD-10-CM | POA: Diagnosis not present

## 2024-08-10 DIAGNOSIS — Z23 Encounter for immunization: Secondary | ICD-10-CM | POA: Diagnosis not present

## 2024-08-10 DIAGNOSIS — G43909 Migraine, unspecified, not intractable, without status migrainosus: Secondary | ICD-10-CM | POA: Diagnosis not present

## 2024-08-10 DIAGNOSIS — I4510 Unspecified right bundle-branch block: Secondary | ICD-10-CM | POA: Diagnosis not present

## 2024-08-10 DIAGNOSIS — Z0000 Encounter for general adult medical examination without abnormal findings: Secondary | ICD-10-CM | POA: Diagnosis not present

## 2024-08-10 DIAGNOSIS — M545 Low back pain, unspecified: Secondary | ICD-10-CM | POA: Diagnosis not present

## 2024-08-10 DIAGNOSIS — R7303 Prediabetes: Secondary | ICD-10-CM | POA: Diagnosis not present

## 2024-08-13 ENCOUNTER — Other Ambulatory Visit (HOSPITAL_BASED_OUTPATIENT_CLINIC_OR_DEPARTMENT_OTHER): Payer: Self-pay | Admitting: Family Medicine

## 2024-08-13 DIAGNOSIS — E7800 Pure hypercholesterolemia, unspecified: Secondary | ICD-10-CM

## 2024-08-21 ENCOUNTER — Ambulatory Visit (HOSPITAL_BASED_OUTPATIENT_CLINIC_OR_DEPARTMENT_OTHER)
Admission: RE | Admit: 2024-08-21 | Discharge: 2024-08-21 | Disposition: A | Discharge status: Home / Self Care | Source: Ambulatory Visit | Attending: Family Medicine | Admitting: Family Medicine

## 2024-08-21 ENCOUNTER — Encounter (HOSPITAL_BASED_OUTPATIENT_CLINIC_OR_DEPARTMENT_OTHER): Payer: Self-pay

## 2024-08-21 DIAGNOSIS — E7800 Pure hypercholesterolemia, unspecified: Secondary | ICD-10-CM | POA: Insufficient documentation

## 2024-09-05 ENCOUNTER — Other Ambulatory Visit: Payer: Self-pay | Admitting: Family Medicine

## 2024-09-05 DIAGNOSIS — M9981 Other biomechanical lesions of cervical region: Secondary | ICD-10-CM

## 2024-09-06 ENCOUNTER — Other Ambulatory Visit: Payer: Self-pay | Admitting: Family Medicine

## 2024-09-06 DIAGNOSIS — I719 Aortic aneurysm of unspecified site, without rupture: Secondary | ICD-10-CM

## 2024-09-10 ENCOUNTER — Encounter: Payer: Self-pay | Admitting: Radiology

## 2024-09-11 ENCOUNTER — Encounter: Payer: Self-pay | Admitting: Radiology

## 2024-09-11 ENCOUNTER — Ambulatory Visit
Admission: RE | Admit: 2024-09-11 | Discharge: 2024-09-11 | Disposition: A | Source: Ambulatory Visit | Attending: Family Medicine | Admitting: Family Medicine

## 2024-09-11 DIAGNOSIS — I719 Aortic aneurysm of unspecified site, without rupture: Secondary | ICD-10-CM

## 2024-09-11 MED ORDER — IOPAMIDOL (ISOVUE-370) INJECTION 76%
75.0000 mL | Freq: Once | INTRAVENOUS | Status: AC | PRN
  Administered 2024-09-11: 75 mL via INTRAVENOUS

## 2024-09-27 ENCOUNTER — Ambulatory Visit
Admission: RE | Admit: 2024-09-27 | Discharge: 2024-09-27 | Disposition: A | Source: Ambulatory Visit | Attending: Family Medicine | Admitting: Family Medicine

## 2024-09-27 DIAGNOSIS — M9981 Other biomechanical lesions of cervical region: Secondary | ICD-10-CM

## 2024-09-27 MED ORDER — GADOPICLENOL 0.5 MMOL/ML IV SOLN
8.0000 mL | Freq: Once | INTRAVENOUS | Status: AC | PRN
  Administered 2024-09-27: 8 mL via INTRAVENOUS

## 2024-09-30 NOTE — Progress Notes (Unsigned)
 Cardiology Office Note:    Date:  10/01/2024   ID:  Dave Ferguson, Dave Ferguson 01/20/1956, MRN [id number]  PCP:  Marvene Prentice SAUNDERS, FNP   [facility] Providers Cardiologist:  Ozell Fell, MD     Referring MD: Morgan Drivers, MD   Chief Complaint  Patient presents with   Thoracic Aortic Aneurysm    History of Present Illness:    Dave Ferguson is a 68 y.o. male self-referred for evaluation of elevated coronary calcium score. He recently underwent a coronary calcium scan with a score of 94, placing him in the 46th percentile. CTA showed an aortic root of 4.7 cm and ascending aorta of 4.3 cm in maximum diameters.   He has a family hx of aneurysm and sudden death in his father but doesn't know any details. He had a half brother who also had a heart attack. He states both were heavy alcohol drinkers. The patient is not engaged in regular exercise but he's physically active. He reports exertional dyspnea with heavy exercise. He gets periodic pain under the left axillary region when he's stressed. Also reports a feeling of 'heartburn' recently when he stated that he could not get comfortable.  Reports that he otherwise feels well.    Past Medical History:  Diagnosis Date   Hyperlipidemia    Migraine headache    Past Surgical History:  Procedure Laterality Date   HIATAL HERNIA REPAIR N/A     Current Medications: Current Meds  Medication Sig   metoprolol  tartrate (LOPRESSOR ) 100 MG tablet Take 2 hours prior to CT   Multiple Vitamins-Minerals (CHOICEFUL MULTIVITAMIN) CAPS Take 1 capsule by mouth daily. (Patient taking differently: Take 1 capsule by mouth daily. COLLAGEN HYDROLYSATE SUPPLEMENT)   OVER THE COUNTER MEDICATION Take 1 Scoop by mouth 3 (three) times a week.     Allergies:   Patient has no allergy information on record.   ROS:   Please see the history of present illness.    All other systems reviewed and are negative.  EKGs/Labs/Other Studies  Reviewed:    The following studies were reviewed today: Cardiac Studies & Procedures   ______________________________________________________________________________________________          CT SCANS  CT CARDIAC SCORING (SELF PAY ONLY) 08/21/2024  Addendum 08/22/2024 11:33 PM ADDENDUM REPORT: 08/22/2024 23:31  EXAM: OVER-READ INTERPRETATION  CT CHEST  The following report is an over-read performed by radiologist Dr. Drivers [facility] on 08/22/2024. This over-read does not include interpretation of cardiac or coronary anatomy or pathology. The coronary calcium score interpretation by the cardiologist is attached.  COMPARISON:  None.  FINDINGS: Heart is normal size. Aneurysmal dilatation of the aortic root measuring 4.6 cm at the sinuses of Valsalva. Ascending thoracic aorta also mildly aneurysmal at 4.1 cm. No adenopathy. No confluent airspace opacities or effusions. No acute findings in the abdomen. Chest wall soft tissues are unremarkable. No acute bony abnormality.  IMPRESSION: 4.6 cm aortic aneurysm at the sinuses of Valsalva/aortic root. Recommend semi-annual imaging followup by CTA or MRA and referral to cardiothoracic surgery if not already obtained. This recommendation follows 2010 ACCF/AHA/AATS/ACR/ASA/SCA/SCAI/SIR/STS/SVM Guidelines for the Diagnosis and Management of Patients With Thoracic Aortic Disease. Circulation. 2010; 121: Z733-z630. Aortic aneurysm NOS (ICD10-I71.9)   Electronically Signed By: Drivers Crease M.D. On: 08/22/2024 23:31  Narrative : CLINICAL DATA:  Cardiovascular Disease Risk stratification  EXAM:  Coronary Calcium Score  TECHNIQUE:  A gated, non-contrast computed tomography scan of the heart was  performed using  3mm slice thickness. Axial images were analyzed on a  dedicated workstation. Calcium scoring of the coronary arteries was  performed using the Agatston method.  FINDINGS:  Coronary Calcium  Score:  Left main: 0  Left anterior descending artery: 86.5  Left circumflex artery: 0  Right coronary artery: 7  Total: 93.5  Pericardium: Normal.  Ascending Aorta: Aneurysm 44 mm  Pulmonary artery: Normal caliber  Non-cardiac: See separate report from [facility].  IMPRESSION:  Coronary calcium score of 93.5. This was 82 percentile for age-, race-,  and sex-matched controls.  Ascending aorta aneurysm 0 44 mm.  RECOMMENDATIONS:  Coronary artery calcium (CAC) score is a strong predictor of  incident coronary heart disease (CHD) and provides predictive  information beyond traditional risk factors. CAC scoring is  reasonable to use in the decision to withhold, postpone, or initiate  statin therapy in intermediate-risk or selected borderline-risk  asymptomatic adults (age 24-75 years and LDL-C >=70 to <190 mg/dL)  who do not have diabetes or established atherosclerotic  cardiovascular disease (ASCVD).* In intermediate-risk (10-year ASCVD  risk >=7.5% to <20%) adults or selected borderline-risk (10-year  ASCVD risk >=5% to <7.5%) adults in whom a CAC score is measured for  the purpose of making a treatment decision the following  recommendations have been made:  If CAC=0, it is reasonable to withhold statin therapy and reassess  in 5 to 10 years, as long as higher risk conditions are absent  (diabetes mellitus, family history of premature CHD in first degree  relatives (males <55 years; females <65 years), cigarette smoking,  or LDL >=190 mg/dL).  If CAC is 1 to 99, it is reasonable to initiate statin therapy for  patients >=81 years of age.  If CAC is >=100 or >=75th percentile, it is reasonable to initiate  statin therapy at any age.  Cardiology referral should be considered for patients with CAC  scores >=400 or >=75th percentile.  *2018 AHA/ACC/AACVPR/AAPA/ABC/ACPM/ADA/AGS/APhA/ASPC/NLA/PCNA  Guideline on the Management of Blood  Cholesterol: A Report of the  American College of Cardiology/American Heart Association Task Force  on Clinical Practice Guidelines. J Am Coll Cardiol.  2019;73(24):3168-3209.  Electronically Signed: By: Lamar Fitch M.D. On: 08/22/2024 13:02     ______________________________________________________________________________________________      EKG:   EKG Interpretation Date/Time:  Monday October 01 2024 09:54:06 EST Ventricular Rate:  84 PR Interval:  170 QRS Duration:  144 QT Interval:  390 QTC Calculation: 460 R Axis:   79  Text Interpretation: Normal sinus rhythm Right bundle branch block When compared with ECG of 20-Apr-2007 05:26, Right bundle branch block has replaced Incomplete right bundle branch block Confirmed by Wonda Sharper [phone]) on 10/01/2024 10:14:32 AM    Recent Labs: No results found for requested labs within last 365 days.  Recent Lipid Panel No results found for: CHOL, TRIG, HDL, CHOLHDL, VLDL, LDLCALC, LDLDIRECT   Risk Assessment/Calculations:                Physical Exam:    VS:  BP 116/77   Pulse 84   Ht 5' 11 (1.803 m)   Wt 176 lb (79.8 kg)   SpO2 96%   BMI 24.55 kg/m     Wt Readings from Last 3 Encounters:  10/01/24 176 lb (79.8 kg)  09/27/24 175 lb (79.4 kg)     GEN:  Well nourished, well developed in no acute distress HEENT: Normal NECK: No JVD; No carotid bruits LYMPHATICS: No lymphadenopathy CARDIAC: RRR, no murmurs, rubs, gallops  RESPIRATORY:  Clear to auscultation without rales, wheezing or rhonchi  ABDOMEN: Soft, non-tender, non-distended MUSCULOSKELETAL:  No edema; No deformity  SKIN: Warm and dry NEUROLOGIC:  Alert and oriented x 3 PSYCHIATRIC:  Normal affect   Assessment & Plan Aneurysm of ascending aorta without rupture Reviewed the patient's CTA study which demonstrates 4.7 cm aortic root at the level of the sinuses and a 4.3 cm ascending aorta.  We discussed aneurysmal disease and  management including enlargement that would prompt surgical referral, avoidance of quinolone antibiotics, and avoidance of extreme heavy lifting.  He otherwise understands that he has no specific restrictions.  I have recommended an MRA of the chest in 1 year. Elevated coronary artery calcium score Discussed risk modification measures.  He has increased his simvastatin from 10 mg to 40 mg recently and we plan to check an updated lipid panel with LFTs in 3 months.  If he remains above goal of 70 mg/dL, we will consider changing him to rosuvastatin +/- ezetimibe. Precordial pain Patient with typical and atypical features.  He does have left axillary pain that occurs with stress.  Based on his risk profile with family history, aneurysmal disease, and chest discomfort, I have recommended a gated coronary CTA for further evaluation. Mixed hyperlipidemia See discussion above.  Check lipids and LFTs after 3 months of increased simvastatin dose with low threshold to change to rosuvastatin +/- ezetimibe if needed to achieve LDL less than 70 mg/dL.       Medication Adjustments/Labs and Tests Ordered: Current medicines are reviewed at length with the patient today.  Concerns regarding medicines are outlined above.  Orders Placed This Encounter  Procedures   CT CORONARY MORPH W/CTA COR W/SCORE W/CA W/CM &/OR WO/CM   MR Angiogram Chest W Contrast   MR Angiogram Chest W Wo Contrast   Lipid panel   Hepatic function panel   Comp Met (CMET)   CBC with Differential/Platelet   EKG 12-Lead   Meds ordered this encounter  Medications   metoprolol  tartrate (LOPRESSOR ) 100 MG tablet    Sig: Take 2 hours prior to CT    Dispense:  1 tablet    Refill:  0    Patient Instructions  Medication Instructions:  Your physician recommends that you continue on your current medications as directed. Please refer to the Current Medication list given to you today.  *If you need a refill on your cardiac medications before  your next appointment, please call your pharmacy*  Lab Work: Today: CMET, CBC In 3 months: Lipids, LFTs If you have labs (blood work) drawn today and your tests are completely normal, you will receive your results only by: MyChart Message (if you have MyChart) OR A paper copy in the mail If you have any lab test that is abnormal or we need to change your treatment, we will call you to review the results.  Testing/Procedures:   Your cardiac CT will be scheduled at one of the below locations:    [facility] [address]  [place], [place] [address]   If scheduled at the [facility] at [address], please enter the parking lot using the [address] entrance and use the FREE valet service at the patient drop-off area. Enter the building and check-in with registration on the main floor.   Please follow these instructions carefully (unless otherwise directed):  An IV will be required for this test and Nitroglycerin will be given.  Hold all erectile  dysfunction medications at least 3 days (72 hrs) prior to test. (Ie viagra, cialis, sildenafil, tadalafil, etc)   On the Night Before the Test: Be sure to Drink plenty of water. Do not consume any caffeinated/decaffeinated beverages or chocolate 12 hours prior to your test. Do not take any antihistamines 12 hours prior to your test.   On the Day of the Test: Drink plenty of water until 1 hour prior to the test. Do not eat any food 1 hour prior to test. You may take your regular medications prior to the test.  Take metoprolol  (Lopressor ) two hours prior to test. If you take Furosemide/Hydrochlorothiazide/Spironolactone/Chlorthalidone, please HOLD on the morning of the test. Patients who wear a continuous glucose monitor MUST remove the device prior to scanning.        After the Test: Drink plenty of water. After receiving IV contrast, you may experience a mild flushed feeling. This  is normal. On occasion, you may experience a mild rash up to 24 hours after the test. This is not dangerous. If this occurs, you can take Benadryl 25 mg, Zyrtec, Claritin, or Allegra and increase your fluid intake. (Patients taking Tikosyn should avoid Benadryl, and may take Zyrtec, Claritin, or Allegra) If you experience trouble breathing, this can be serious. If it is severe call 911 IMMEDIATELY. If it is mild, please call our office.  We will call to schedule your test 2-4 weeks out understanding that some insurance companies will need an authorization prior to the service being performed.   For more information and frequently asked questions, please visit our website : [email or web address]  For non-scheduling related questions, please contact the cardiac imaging nurse navigator should you have any questions/concerns: Cardiac Imaging Nurse Navigators Direct Office Dial: [phone]   For scheduling needs, including cancellations and rescheduling, please call Brittany, [phone].  Chest MRA - Magnetic Resonance Angiogram Magnetic resonance imaging (MRI) is a painless test that produces images of the inside of the body without using X-rays. During an MRI, strong magnets and radio waves work together in a data processing manager to form detailed images. MRI images may provide more details about a medical condition than X-rays, CT scans, and ultrasounds can provide. A magnetic resonance angiogram (MRA) is an MRI done on your blood vessels. During an MRA, dye (contrast material or contrast dye) is injected into your body to make the images even clearer. An MRA provides images of blood vessels and blood flow. It can be used to help diagnose and treat heart disorders, stroke, and blood vessel diseases. Tell a health care provider about: Any surgeries you have had. Any bleeding problems you have. Any allergies you have. This is especially important if you have had a previous allergic reaction  to contrast dye or iodine. Any metal you may have in your body. The magnets used in an MRA can cause metal objects in your body to move. Metal can also make it hard to get high-quality images. Objects that may contain metal include: Any joint replacement (prosthesis), such as an artificial knee or hip. An implanted defibrillator, pacemaker, or neurostimulator. A metallic ear implant (cochlear implant). An artificial heart valve or vascular stents, filters, or coils. A metallic object in the eye socket. Metal splinters or bullet fragments. A port for delivering insulin or chemotherapy. Any medical conditions you have, especially if you have kidney problems. All medicines you are taking, including vitamins, herbs, eye drops, creams, and over-the-counter medicines. Any tattoos you have. Some red dyes contain iron, which can  cause problems with testing. Whether you are pregnant, may be pregnant, or are breastfeeding. Any fear of cramped spaces (claustrophobia). If this is a problem, it usually can be relieved with medicines. What are the risks? Generally, this is a safe test. However, problems can occur: If you have metal in your body, it may be affected by the magnets used during the test. If you have a metallic implant close to the area being tested, it may be hard to get high-quality images. If you are pregnant, you should avoid MRI tests, including MRAs, during the first 3 months of pregnancy. An MRI may have effects on an unborn baby. If you are breastfeeding, you may need to stop temporarily. Your breast milk may contain contrast material until the material naturally leaves your body. Other possible problems include: Pain or irritation at the IV insertion site. Allergic reaction to the contrast dye. A rare reaction called nephrogenic systemic fibrosis that causes damage to skin and other organs. What happens before the procedure? You will be asked to leave all jewelry at home. You may be  asked to wear a gown. If you are breastfeeding, follow instructions from your health care provider about pumping before your test and stopping breastfeeding temporarily. You will be asked to remove all metal, including: Any watches or other metal objects, like hair pins. Hearing aids or dentures. Mobile phone. Underwire bra. Makeup. Certain kinds of makeup contain small amounts of metal. Dental braces and fillings are normally not a problem. What happens during the procedure?  You may be given earplugs or headphones to listen to music. The machine can be noisy. An IV will be inserted into one of your veins. Contrast material will be injected into your IV. You will lie down on a platform, similar to a long table. The platform will slide into a long tunnel that has magnets inside of it. When you are inside the tunnel, you will still be able to talk to your health care provider. You will be asked to lie very still while images are taken. Your health care provider will tell you when you can move. You may have to wait a few minutes to make sure that the images produced during the test are clear. When all images are produced, the platform will slide out of the tunnel. The procedure may vary among health care providers and hospitals. What can I expect after the procedure? If you are breastfeeding, do not breastfeed your child until your health care provider says that this is safe. It is up to you to get your test results. Ask your health care provider, or the department that is doing the test, when your results will be ready. Follow these instructions at home: Activity If you were given a sedative during the procedure, it can affect you for several hours. Do not drive or operate machinery until your health care provider says that it is safe. Return to your normal activities as told by your health care provider. Ask your health care provider what activities are safe for you. General  instructions Check your IV insertion area every day for signs of infection. Check for: Redness, swelling, or pain. Fluid or blood. Warmth. Pus or a bad smell. The contrast material will leave your body through your urine within a day. You may be told to drink plenty of fluids to help flush the contrast material out of your system. Keep all follow-up visits. This is important. Contact a health care provider if: You have any  symptoms of allergy to the contrast dye. These include: Shortness of breath. Rash or itchy, red, swollen areas of skin (hives). A racing heartbeat. You have redness, swelling, warmth, pus, or pain around the IV insertion site. Summary Magnetic resonance imaging (MRI) is a painless test that uses strong magnets and radio waves working together in a magnetic field to form very detailed and sharp images. An MRA provides images of blood vessels and blood flow. Before your MRA, be sure to tell your health care provider about any metal you may have in your body, any allergies, if you have kidney problems, or if you are pregnant or breastfeeding. Be sure to tell your health care provider if you have any fear of cramped spaces (claustrophobia). You may be told to drink plenty of fluids to help flush the contrast material out of your system. This information is not intended to replace advice given to you by your health care provider. Make sure you discuss any questions you have with your health care provider. Document Revised: 02/16/2024 Document Reviewed: 02/24/2021 Elsevier Patient Education  2025 [employer].   Follow-Up: At [facility] you and your health needs are our priority.  As part of our continuing mission to provide you with exceptional heart care, our providers are all part of one team.  This team includes your primary Cardiologist (physician) and Advanced Practice Providers or APPs (Physician Assistants and Nurse Practitioners) who all work together to  provide you with the care you need, when you need it.  Your next appointment:   1 year(s)  Provider:   Ozell Fell, MD    Other instructions: Please avoid quinolones/fluoroquinolones antibiotics (examples below):   ciprofloxacin delafloxacin gemifloxacin levofloxacin moxifloxacin ofloxacin.    Signed, Ozell Fell, MD  10/01/2024 12:42 PM    [facility]

## 2024-10-01 ENCOUNTER — Encounter: Payer: Self-pay | Admitting: Cardiovascular Disease

## 2024-10-01 ENCOUNTER — Ambulatory Visit: Attending: Cardiovascular Disease | Admitting: Cardiovascular Disease

## 2024-10-01 VITALS — BP 116/77 | HR 84 | Ht 71.0 in | Wt 176.0 lb

## 2024-10-01 DIAGNOSIS — R072 Precordial pain: Secondary | ICD-10-CM | POA: Diagnosis not present

## 2024-10-01 DIAGNOSIS — I7121 Aneurysm of the ascending aorta, without rupture: Secondary | ICD-10-CM | POA: Diagnosis not present

## 2024-10-01 DIAGNOSIS — R931 Abnormal findings on diagnostic imaging of heart and coronary circulation: Secondary | ICD-10-CM

## 2024-10-01 DIAGNOSIS — E782 Mixed hyperlipidemia: Secondary | ICD-10-CM | POA: Diagnosis not present

## 2024-10-01 MED ORDER — METOPROLOL TARTRATE 100 MG PO TABS: ORAL_TABLET | ORAL | 0 refills | Status: AC

## 2024-10-01 NOTE — Addendum Note (Signed)
 Addended by: MELIDA ROLIN HERO on: 10/01/2024 02:09 PM   Modules accepted: Orders

## 2024-10-01 NOTE — Patient Instructions (Addendum)
 Medication Instructions:  Your physician recommends that you continue on your current medications as directed. Please refer to the Current Medication list given to you today.  *If you need a refill on your cardiac medications before your next appointment, please call your pharmacy*  Lab Work: Today: CMET, CBC In 3 months: Lipids, LFTs If you have labs (blood work) drawn today and your tests are completely normal, you will receive your results only by: MyChart Message (if you have MyChart) OR A paper copy in the mail If you have any lab test that is abnormal or we need to change your treatment, we will call you to review the results.  Testing/Procedures:   Your cardiac CT will be scheduled at one of the below locations:    [facility] [address]  [place], [place] [address]   If scheduled at the [facility] at [address], please enter the parking lot using the [address] entrance and use the FREE valet service at the patient drop-off area. Enter the building and check-in with registration on the main floor.   Please follow these instructions carefully (unless otherwise directed):  An IV will be required for this test and Nitroglycerin will be given.  Hold all erectile dysfunction medications at least 3 days (72 hrs) prior to test. (Ie viagra, cialis, sildenafil, tadalafil, etc)   On the Night Before the Test: Be sure to Drink plenty of water. Do not consume any caffeinated/decaffeinated beverages or chocolate 12 hours prior to your test. Do not take any antihistamines 12 hours prior to your test.   On the Day of the Test: Drink plenty of water until 1 hour prior to the test. Do not eat any food 1 hour prior to test. You may take your regular medications prior to the test.  Take metoprolol  (Lopressor ) two hours prior to test. If you take Furosemide/Hydrochlorothiazide/Spironolactone/Chlorthalidone, please HOLD on the  morning of the test. Patients who wear a continuous glucose monitor MUST remove the device prior to scanning.        After the Test: Drink plenty of water. After receiving IV contrast, you may experience a mild flushed feeling. This is normal. On occasion, you may experience a mild rash up to 24 hours after the test. This is not dangerous. If this occurs, you can take Benadryl 25 mg, Zyrtec, Claritin, or Allegra and increase your fluid intake. (Patients taking Tikosyn should avoid Benadryl, and may take Zyrtec, Claritin, or Allegra) If you experience trouble breathing, this can be serious. If it is severe call [phone] IMMEDIATELY. If it is mild, please call our office.  We will call to schedule your test 2-4 weeks out understanding that some insurance companies will need an authorization prior to the service being performed.   For more information and frequently asked questions, please visit our website : [email or web address]  For non-scheduling related questions, please contact the cardiac imaging nurse navigator should you have any questions/concerns: Cardiac Imaging Nurse Navigators Direct Office Dial: [phone]   For scheduling needs, including cancellations and rescheduling, please call Brittany, [phone].  Chest MRA - Magnetic Resonance Angiogram Magnetic resonance imaging (MRI) is a painless test that produces images of the inside of the body without using X-rays. During an MRI, strong magnets and radio waves work together in a data processing manager to form detailed images. MRI images may provide more details about a medical condition than X-rays, CT scans, and ultrasounds can provide. A magnetic resonance angiogram (MRA)  is an MRI done on your blood vessels. During an MRA, dye (contrast material or contrast dye) is injected into your body to make the images even clearer. An MRA provides images of blood vessels and blood flow. It can be used to help diagnose and treat heart  disorders, stroke, and blood vessel diseases. Tell a health care provider about: Any surgeries you have had. Any bleeding problems you have. Any allergies you have. This is especially important if you have had a previous allergic reaction to contrast dye or iodine. Any metal you may have in your body. The magnets used in an MRA can cause metal objects in your body to move. Metal can also make it hard to get high-quality images. Objects that may contain metal include: Any joint replacement (prosthesis), such as an artificial knee or hip. An implanted defibrillator, pacemaker, or neurostimulator. A metallic ear implant (cochlear implant). An artificial heart valve or vascular stents, filters, or coils. A metallic object in the eye socket. Metal splinters or bullet fragments. A port for delivering insulin or chemotherapy. Any medical conditions you have, especially if you have kidney problems. All medicines you are taking, including vitamins, herbs, eye drops, creams, and over-the-counter medicines. Any tattoos you have. Some red dyes contain iron, which can cause problems with testing. Whether you are pregnant, may be pregnant, or are breastfeeding. Any fear of cramped spaces (claustrophobia). If this is a problem, it usually can be relieved with medicines. What are the risks? Generally, this is a safe test. However, problems can occur: If you have metal in your body, it may be affected by the magnets used during the test. If you have a metallic implant close to the area being tested, it may be hard to get high-quality images. If you are pregnant, you should avoid MRI tests, including MRAs, during the first 3 months of pregnancy. An MRI may have effects on an unborn baby. If you are breastfeeding, you may need to stop temporarily. Your breast milk may contain contrast material until the material naturally leaves your body. Other possible problems include: Pain or irritation at the IV insertion  site. Allergic reaction to the contrast dye. A rare reaction called nephrogenic systemic fibrosis that causes damage to skin and other organs. What happens before the procedure? You will be asked to leave all jewelry at home. You may be asked to wear a gown. If you are breastfeeding, follow instructions from your health care provider about pumping before your test and stopping breastfeeding temporarily. You will be asked to remove all metal, including: Any watches or other metal objects, like hair pins. Hearing aids or dentures. Mobile phone. Underwire bra. Makeup. Certain kinds of makeup contain small amounts of metal. Dental braces and fillings are normally not a problem. What happens during the procedure?  You may be given earplugs or headphones to listen to music. The machine can be noisy. An IV will be inserted into one of your veins. Contrast material will be injected into your IV. You will lie down on a platform, similar to a long table. The platform will slide into a long tunnel that has magnets inside of it. When you are inside the tunnel, you will still be able to talk to your health care provider. You will be asked to lie very still while images are taken. Your health care provider will tell you when you can move. You may have to wait a few minutes to make sure that the images produced during the  test are clear. When all images are produced, the platform will slide out of the tunnel. The procedure may vary among health care providers and hospitals. What can I expect after the procedure? If you are breastfeeding, do not breastfeed your child until your health care provider says that this is safe. It is up to you to get your test results. Ask your health care provider, or the department that is doing the test, when your results will be ready. Follow these instructions at home: Activity If you were given a sedative during the procedure, it can affect you for several hours. Do not  drive or operate machinery until your health care provider says that it is safe. Return to your normal activities as told by your health care provider. Ask your health care provider what activities are safe for you. General instructions Check your IV insertion area every day for signs of infection. Check for: Redness, swelling, or pain. Fluid or blood. Warmth. Pus or a bad smell. The contrast material will leave your body through your urine within a day. You may be told to drink plenty of fluids to help flush the contrast material out of your system. Keep all follow-up visits. This is important. Contact a health care provider if: You have any symptoms of allergy to the contrast dye. These include: Shortness of breath. Rash or itchy, red, swollen areas of skin (hives). A racing heartbeat. You have redness, swelling, warmth, pus, or pain around the IV insertion site. Summary Magnetic resonance imaging (MRI) is a painless test that uses strong magnets and radio waves working together in a magnetic field to form very detailed and sharp images. An MRA provides images of blood vessels and blood flow. Before your MRA, be sure to tell your health care provider about any metal you may have in your body, any allergies, if you have kidney problems, or if you are pregnant or breastfeeding. Be sure to tell your health care provider if you have any fear of cramped spaces (claustrophobia). You may be told to drink plenty of fluids to help flush the contrast material out of your system. This information is not intended to replace advice given to you by your health care provider. Make sure you discuss any questions you have with your health care provider. Document Revised: 02/16/2024 Document Reviewed: 02/24/2021 Elsevier Patient Education  2025 [employer].   Follow-Up: At [facility], you and your health needs are our priority.  As part of our continuing mission to provide you with  exceptional heart care, our providers are all part of one team.  This team includes your primary Cardiologist (physician) and Advanced Practice Providers or APPs (Physician Assistants and Nurse Practitioners) who all work together to provide you with the care you need, when you need it.  Your next appointment:   1 year(s)  Provider:   Ozell Fell, MD    Other instructions: Please avoid quinolones/fluoroquinolones antibiotics (examples below):   ciprofloxacin delafloxacin gemifloxacin levofloxacin moxifloxacin ofloxacin.

## 2024-10-05 ENCOUNTER — Ambulatory Visit: Admitting: Cardiology

## 2024-10-08 ENCOUNTER — Encounter (HOSPITAL_COMMUNITY): Payer: Self-pay

## 2024-10-10 ENCOUNTER — Ambulatory Visit (HOSPITAL_COMMUNITY): Admission: RE | Admit: 2024-10-10 | Discharge: 2024-10-10 | Attending: Cardiology | Admitting: Cardiology

## 2024-10-10 DIAGNOSIS — R072 Precordial pain: Secondary | ICD-10-CM | POA: Diagnosis present

## 2024-10-10 MED ORDER — NITROGLYCERIN 0.4 MG SL SUBL
0.8000 mg | SUBLINGUAL_TABLET | Freq: Once | SUBLINGUAL | Status: AC
  Administered 2024-10-10: 12:00:00 0.8 mg via SUBLINGUAL

## 2024-10-10 MED ORDER — IOHEXOL 350 MG/ML SOLN
100.0000 mL | Freq: Once | INTRAVENOUS | Status: AC | PRN
  Administered 2024-10-10: 12:00:00 100 mL via INTRAVENOUS

## 2024-10-29 DIAGNOSIS — E782 Mixed hyperlipidemia: Secondary | ICD-10-CM

## 2024-10-29 DIAGNOSIS — I7121 Aneurysm of the ascending aorta, without rupture: Secondary | ICD-10-CM
